# Patient Record
Sex: Male | Born: 1965 | Race: White | Hispanic: No | Marital: Married
Health system: Southern US, Community
[De-identification: ages and names within clinical notes are randomized; demographics above are authoritative.]

---

## 2022-03-07 ENCOUNTER — Other Ambulatory Visit: Payer: Self-pay | Admitting: Student

## 2022-03-07 DIAGNOSIS — M5440 Lumbago with sciatica, unspecified side: Secondary | ICD-10-CM

## 2022-03-08 ENCOUNTER — Ambulatory Visit
Admission: RE | Admit: 2022-03-08 | Discharge: 2022-03-08 | Disposition: A | Discharge status: Home / Self Care | Payer: BC Managed Care – PPO | Source: Ambulatory Visit | Attending: Student | Admitting: Student

## 2022-03-08 ENCOUNTER — Other Ambulatory Visit: Payer: Self-pay | Admitting: Student

## 2022-03-08 DIAGNOSIS — M5440 Lumbago with sciatica, unspecified side: Secondary | ICD-10-CM

## 2022-03-08 MED ORDER — IOPAMIDOL (ISOVUE-M 200) INJECTION 41%: 1.0000 mL | Freq: Once | INTRAMUSCULAR | Status: DC

## 2022-03-08 MED ORDER — METHYLPREDNISOLONE ACETATE 40 MG/ML INJ SUSP (RADIOLOG: 80.0000 mg | Freq: Once | INTRAMUSCULAR | Status: DC

## 2022-03-08 NOTE — Discharge Instructions (Signed)

## 2022-04-11 ENCOUNTER — Other Ambulatory Visit: Payer: Self-pay | Admitting: Student

## 2022-04-11 DIAGNOSIS — M5440 Lumbago with sciatica, unspecified side: Secondary | ICD-10-CM

## 2022-04-16 ENCOUNTER — Ambulatory Visit
Admission: RE | Admit: 2022-04-16 | Discharge: 2022-04-16 | Disposition: A | Discharge status: Home / Self Care | Payer: BC Managed Care – PPO | Source: Ambulatory Visit | Attending: Student | Admitting: Student

## 2022-04-16 DIAGNOSIS — M5440 Lumbago with sciatica, unspecified side: Secondary | ICD-10-CM

## 2022-04-16 MED ORDER — IOPAMIDOL (ISOVUE-M 200) INJECTION 41%
1.0000 mL | Freq: Once | INTRAMUSCULAR | Status: AC
  Administered 2022-04-16: 1 mL via EPIDURAL

## 2022-04-16 MED ORDER — METHYLPREDNISOLONE ACETATE 40 MG/ML INJ SUSP (RADIOLOG
80.0000 mg | Freq: Once | INTRAMUSCULAR | Status: AC
  Administered 2022-04-16: 80 mg via EPIDURAL

## 2022-04-16 NOTE — Discharge Instructions (Signed)

## 2022-04-24 ENCOUNTER — Other Ambulatory Visit: Payer: BC Managed Care – PPO

## 2022-05-22 ENCOUNTER — Other Ambulatory Visit: Payer: BC Managed Care – PPO

## 2022-05-23 ENCOUNTER — Other Ambulatory Visit: Payer: Self-pay | Admitting: Student

## 2022-05-23 ENCOUNTER — Ambulatory Visit
Admission: RE | Admit: 2022-05-23 | Discharge: 2022-05-23 | Disposition: A | Discharge status: Home / Self Care | Payer: Self-pay | Source: Ambulatory Visit | Attending: Student | Admitting: Student

## 2022-05-23 DIAGNOSIS — M5440 Lumbago with sciatica, unspecified side: Secondary | ICD-10-CM

## 2022-05-23 MED ORDER — IOPAMIDOL (ISOVUE-M 200) INJECTION 41%
1.0000 mL | Freq: Once | INTRAMUSCULAR | Status: AC
  Administered 2022-05-23: 1 mL via EPIDURAL

## 2022-05-23 MED ORDER — METHYLPREDNISOLONE ACETATE 40 MG/ML INJ SUSP (RADIOLOG
80.0000 mg | Freq: Once | INTRAMUSCULAR | Status: AC
  Administered 2022-05-23: 80 mg via EPIDURAL

## 2022-05-23 NOTE — Discharge Instructions (Signed)

## 2022-08-01 ENCOUNTER — Other Ambulatory Visit: Payer: Self-pay | Admitting: Student

## 2022-08-01 DIAGNOSIS — M5440 Lumbago with sciatica, unspecified side: Secondary | ICD-10-CM

## 2023-04-13 IMAGING — XA Imaging study
2 series · 2 of 2 positions shown · non-contrast
Comparison: none

CLINICAL DATA: Lumbosacral spondylosis without myelopathy. Low back
and leg pain, worse on the right. Better response to L3-L4
interlaminar injection last month versus bilateral L3-L4
transforaminal injections in [REDACTED]. Repeat injection requested.

[Series 1: ortho standard · 1 of 1 slices shown (1 of 2)]
[im 1/1]
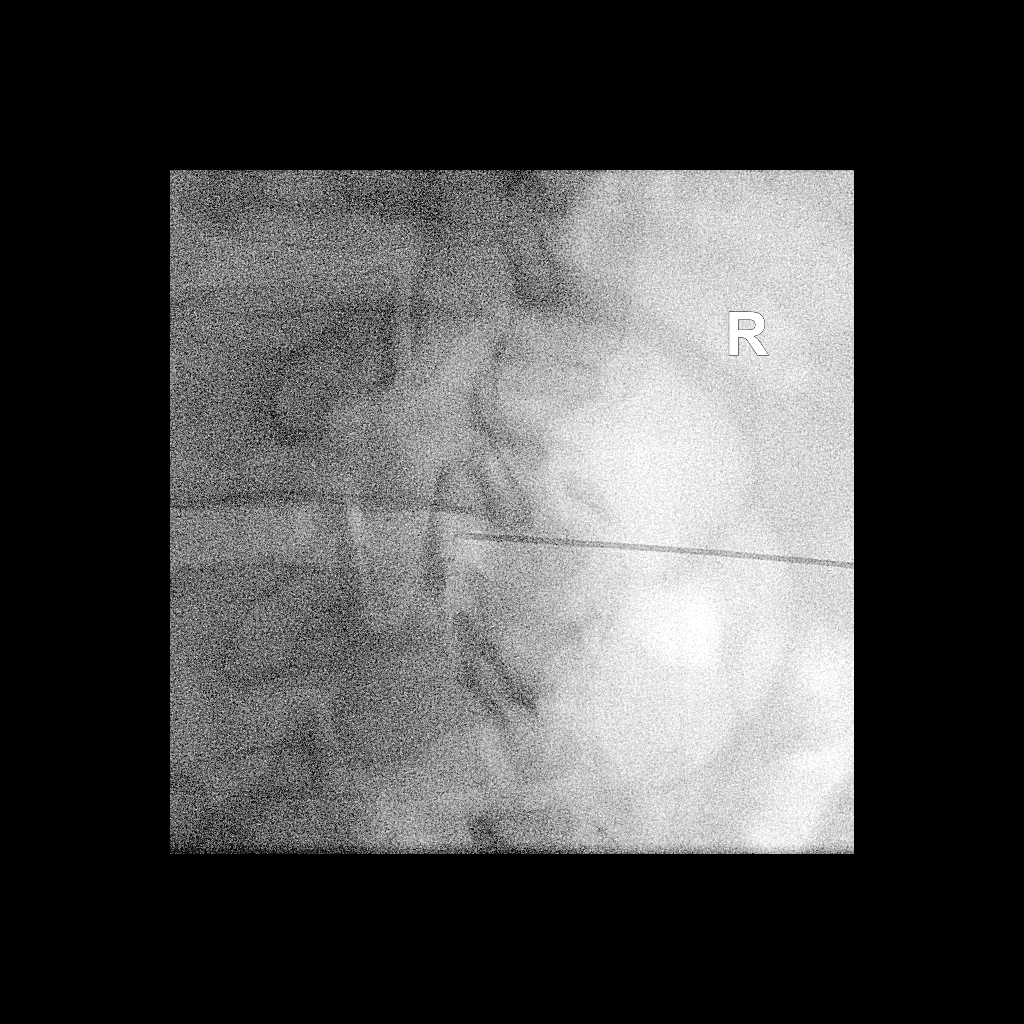

[Series 2: ortho standard · 1 of 1 slices shown (2 of 2)]
[im 1/1]
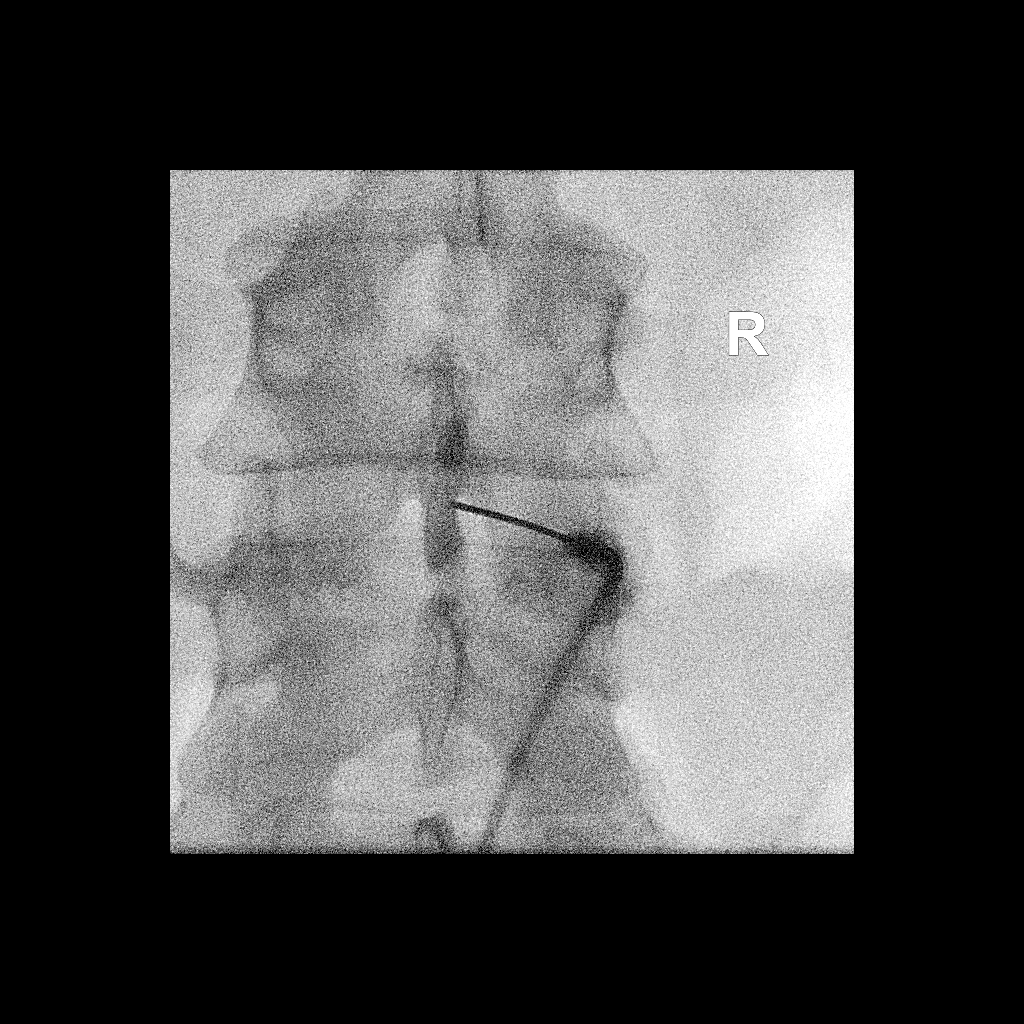

[2 of 2 positions shown; findings below may reference images not displayed]

FLUOROSCOPY:
Radiation Exposure Index (as provided by the fluoroscopic device):
1.1 mGy Kerma

PROCEDURE:
The procedure, risks, benefits, and alternatives were explained to
the patient. Questions regarding the procedure were encouraged and
answered. The patient understands and consents to the procedure.

LUMBAR EPIDURAL INJECTION:

An interlaminar approach was performed on the right at L3-L4. The
overlying skin was cleansed and anesthetized. A 3.5 inch 20 gauge
epidural needle was advanced using loss-of-resistance technique.

DIAGNOSTIC EPIDURAL INJECTION:

Injection of Isovue-M 200 shows a good epidural pattern with spread
above and below the level of needle placement, primarily in the
midline and towards the right. No vascular opacification is seen.

THERAPEUTIC EPIDURAL INJECTION:

80 mg of Depo-Medrol mixed with 3 mL of 1% lidocaine were instilled.
The procedure was well-tolerated, and the patient was discharged
thirty minutes following the injection in good condition.

COMPLICATIONS:
None immediate.
IMPRESSION: Technically successful interlaminar epidural injection on the right
at L3-L4.
